# Patient Record
Sex: Male | Born: 1990 | Race: Black or African American | Hispanic: No | Marital: Single | Smoking: Current every day smoker
Health system: Southern US, Community
[De-identification: ages and names within clinical notes are randomized; demographics above are authoritative.]

## PROBLEM LIST (undated history)

## (undated) DIAGNOSIS — I10 Essential (primary) hypertension: Secondary | ICD-10-CM

## (undated) HISTORY — PX: HAND SURGERY: SHX662

---
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
## 2014-06-27 ENCOUNTER — Encounter (HOSPITAL_BASED_OUTPATIENT_CLINIC_OR_DEPARTMENT_OTHER): Payer: Self-pay | Admitting: Emergency Medicine

## 2014-06-27 ENCOUNTER — Emergency Department (HOSPITAL_BASED_OUTPATIENT_CLINIC_OR_DEPARTMENT_OTHER)
Admission: EM | Admit: 2014-06-27 | Discharge: 2014-06-28 | Disposition: A | Discharge status: Home / Self Care | Payer: Self-pay | Attending: Emergency Medicine | Admitting: Emergency Medicine

## 2014-06-27 DIAGNOSIS — F17200 Nicotine dependence, unspecified, uncomplicated: Secondary | ICD-10-CM | POA: Insufficient documentation

## 2014-06-27 DIAGNOSIS — I10 Essential (primary) hypertension: Secondary | ICD-10-CM | POA: Insufficient documentation

## 2014-06-27 DIAGNOSIS — R1110 Vomiting, unspecified: Secondary | ICD-10-CM | POA: Insufficient documentation

## 2014-06-27 DIAGNOSIS — R197 Diarrhea, unspecified: Secondary | ICD-10-CM | POA: Insufficient documentation

## 2014-06-27 DIAGNOSIS — J020 Streptococcal pharyngitis: Secondary | ICD-10-CM | POA: Insufficient documentation

## 2014-06-27 HISTORY — DX: Essential (primary) hypertension: I10

## 2014-06-27 LAB — CBC WITH DIFFERENTIAL/PLATELET
BASOS ABS: 0 10*3/uL (ref 0.0–0.1)
BASOS PCT: 0 % (ref 0–1)
Eosinophils Absolute: 0 10*3/uL (ref 0.0–0.7)
Eosinophils Relative: 0 % (ref 0–5)
HCT: 43.6 % (ref 39.0–52.0)
Hemoglobin: 15.1 g/dL (ref 13.0–17.0)
Lymphocytes Relative: 15 % (ref 12–46)
Lymphs Abs: 2.4 10*3/uL (ref 0.7–4.0)
MCH: 30.3 pg (ref 26.0–34.0)
MCHC: 34.6 g/dL (ref 30.0–36.0)
MCV: 87.6 fL (ref 78.0–100.0)
MONO ABS: 1.4 10*3/uL — AB (ref 0.1–1.0)
Monocytes Relative: 9 % (ref 3–12)
NEUTROS ABS: 12.4 10*3/uL — AB (ref 1.7–7.7)
Neutrophils Relative %: 76 % (ref 43–77)
PLATELETS: 211 10*3/uL (ref 150–400)
RBC: 4.98 MIL/uL (ref 4.22–5.81)
RDW: 12.4 % (ref 11.5–15.5)
WBC: 16.3 10*3/uL — ABNORMAL HIGH (ref 4.0–10.5)

## 2014-06-27 MED ORDER — ONDANSETRON 8 MG PO TBDP
8.0000 mg | ORAL_TABLET | Freq: Once | ORAL | Status: AC
  Administered 2014-06-27: 8 mg via ORAL

## 2014-06-27 MED ORDER — ACETAMINOPHEN 500 MG PO TABS
ORAL_TABLET | ORAL | Status: AC
  Filled 2014-06-27: qty 2

## 2014-06-27 MED ORDER — ACETAMINOPHEN 325 MG PO TABS
650.0000 mg | ORAL_TABLET | Freq: Once | ORAL | Status: AC
  Administered 2014-06-27: 650 mg via ORAL
  Filled 2014-06-27: qty 2

## 2014-06-27 MED ORDER — SODIUM CHLORIDE 0.9 % IV BOLUS (SEPSIS)
1000.0000 mL | Freq: Once | INTRAVENOUS | Status: AC
  Administered 2014-06-27: 1000 mL via INTRAVENOUS

## 2014-06-27 MED ORDER — ONDANSETRON 8 MG PO TBDP
ORAL_TABLET | ORAL | Status: AC
  Filled 2014-06-27: qty 1

## 2014-06-27 NOTE — ED Notes (Signed)
Pt n/v/d fever chills x 2 days

## 2014-06-28 ENCOUNTER — Emergency Department (HOSPITAL_BASED_OUTPATIENT_CLINIC_OR_DEPARTMENT_OTHER): Payer: Self-pay

## 2014-06-28 LAB — URINALYSIS, ROUTINE W REFLEX MICROSCOPIC
Bilirubin Urine: NEGATIVE
Glucose, UA: NEGATIVE mg/dL
Hgb urine dipstick: NEGATIVE
Ketones, ur: NEGATIVE mg/dL
Leukocytes, UA: NEGATIVE
NITRITE: NEGATIVE
Protein, ur: NEGATIVE mg/dL
SPECIFIC GRAVITY, URINE: 1.021 (ref 1.005–1.030)
Urobilinogen, UA: 0.2 mg/dL (ref 0.0–1.0)
pH: 6 (ref 5.0–8.0)

## 2014-06-28 LAB — COMPREHENSIVE METABOLIC PANEL
ALBUMIN: 3.8 g/dL (ref 3.5–5.2)
ALT: 80 U/L — ABNORMAL HIGH (ref 0–53)
AST: 41 U/L — AB (ref 0–37)
Alkaline Phosphatase: 45 U/L (ref 39–117)
Anion gap: 12 (ref 5–15)
BUN: 11 mg/dL (ref 6–23)
CHLORIDE: 100 meq/L (ref 96–112)
CO2: 26 mEq/L (ref 19–32)
CREATININE: 0.9 mg/dL (ref 0.50–1.35)
Calcium: 9.5 mg/dL (ref 8.4–10.5)
GFR calc Af Amer: >90 mL/min (ref >90)
GFR calc non Af Amer: >90 mL/min (ref >90)
Glucose, Bld: 116 mg/dL — ABNORMAL HIGH (ref 70–99)
Potassium: 3.8 mEq/L (ref 3.7–5.3)
Sodium: 138 mEq/L (ref 137–147)
Total Bilirubin: 0.5 mg/dL (ref 0.3–1.2)
Total Protein: 7.2 g/dL (ref 6.0–8.3)

## 2014-06-28 LAB — RAPID STREP SCREEN (MED CTR MEBANE ONLY): STREPTOCOCCUS, GROUP A SCREEN (DIRECT): POSITIVE — AB

## 2014-06-28 LAB — LIPASE, BLOOD: LIPASE: 24 U/L (ref 11–59)

## 2014-06-28 MED ORDER — IBUPROFEN 800 MG PO TABS: 800.0000 mg | ORAL_TABLET | Freq: Three times a day (TID) | ORAL | Status: DC

## 2014-06-28 MED ORDER — PENICILLIN G BENZATHINE 1200000 UNIT/2ML IM SUSP
1.2000 10*6.[IU] | Freq: Once | INTRAMUSCULAR | Status: AC
  Administered 2014-06-28: 1.2 10*6.[IU] via INTRAMUSCULAR
  Filled 2014-06-28: qty 2

## 2014-06-28 NOTE — ED Provider Notes (Signed)
Medical screening examination/treatment/procedure(s) were performed by non-physician practitioner and as supervising physician I was immediately available for consultation/collaboration.   EKG Interpretation None       Hillary Schwegler K Sieara Bremer-Rasch, MD 06/28/14 [phone]

## 2014-06-28 NOTE — ED Provider Notes (Signed)
CSN: [id number]     Arrival date & time 06/27/14  2215 History   First MD Initiated Contact with Patient 06/27/14 2349     Chief Complaint  Patient presents with  . Emesis     (Consider location/radiation/quality/duration/timing/severity/associated sxs/prior Treatment) HPI Comments: Victor Richards is a 23 y.o. Male with a PMHx of HTN presenting today with fever, N/V/D and body aches x 1 day associated with decreased appetite but no abd pain. States he felt febrile yesterday and left work, but did not take his temperature. Has had a cough x 2 days and sore throat x2 days, with white sputum production but no SOB or CP. Denies HA, rhinorrhea, sinus pressure/congestion, ear pain, eye pain or discharge, abd pain, bloody stools, dysuria, hematuria, rashes or tick bites, penile discharge or pain. Denies any sick contacts. Denies environmental allergies. States he smokes 1/2ppd and occasionally smokes marijuana. Has not tried anything for his symptoms, and there are no aggravating factors. Denies trismus or muffled voice. Denies stridor or wheezing.  Patient is a 23 y.o. male presenting with fever. The history is provided by the patient. No language interpreter was used.  Fever Max temp prior to arrival:  Unknown Temp source:  Subjective Severity:  Moderate Onset quality:  Sudden Duration:  1 day Timing:  Constant Progression:  Unchanged Chronicity:  New Relieved by:  None tried Worsened by:  Nothing tried Ineffective treatments:  None tried Associated symptoms: chills, cough, diarrhea, myalgias, nausea, sore throat and vomiting   Associated symptoms: no chest pain, no confusion, no congestion, no dysuria, no ear pain, no headaches, no rash and no rhinorrhea   Risk factors: no recent travel and no sick contacts     Past Medical History  Diagnosis Date  . Hypertension    Past Surgical History  Procedure Laterality Date  . Hand surgery     History reviewed. No pertinent family  history. History  Substance Use Topics  . Smoking status: Current Every Day Smoker -- 0.50 packs/day    Types: Cigarettes  . Smokeless tobacco: Not on file  . Alcohol Use: No    Review of Systems  Constitutional: Positive for fever, chills and appetite change.  HENT: Positive for sore throat. Negative for congestion, drooling, ear discharge, ear pain, hearing loss, mouth sores, postnasal drip, rhinorrhea, sinus pressure, sneezing, tinnitus, trouble swallowing and voice change.   Eyes: Negative for redness and itching.  Respiratory: Positive for cough. Negative for shortness of breath, wheezing and stridor.   Cardiovascular: Negative for chest pain, palpitations and leg swelling.  Gastrointestinal: Positive for nausea, vomiting and diarrhea. Negative for abdominal pain, constipation and blood in stool.  Genitourinary: Negative for dysuria, urgency, hematuria, flank pain, decreased urine volume, penile swelling and testicular pain.  Musculoskeletal: Positive for myalgias. Negative for arthralgias, back pain, neck pain and neck stiffness.  Skin: Negative for color change and rash.  Allergic/Immunologic: Negative for environmental allergies.  Neurological: Negative for dizziness, syncope, weakness and headaches.  Psychiatric/Behavioral: Negative for confusion.  10 Systems reviewed and are negative for acute change except as noted in the HPI.     Allergies  Review of patient's allergies indicates no known allergies.  Home Medications   Prior to Admission medications   Not on File   BP 137/80  Pulse 119  Temp(Src) 101.7 F (38.7 C)  Resp 16  Ht 6\' 5"  (1.956 m)  Wt 310 lb (140.615 kg)  BMI 36.75 kg/m2  SpO2 98% Physical Exam  Nursing note  and vitals reviewed. Constitutional: He is oriented to person, place, and time. He appears well-developed and well-nourished. No distress.  Febrile, nontoxic, appears fatigued  HENT:  Head: Normocephalic and atraumatic.  Right Ear:  Tympanic membrane, external ear and ear canal normal.  Left Ear: Tympanic membrane, external ear and ear canal normal.  Nose: No mucosal edema, rhinorrhea or sinus tenderness. Right sinus exhibits no maxillary sinus tenderness and no frontal sinus tenderness. Left sinus exhibits no maxillary sinus tenderness and no frontal sinus tenderness.  Mouth/Throat: Uvula is midline and mucous membranes are normal. No trismus in the jaw. No uvula swelling. Posterior oropharyngeal erythema present. No oropharyngeal exudate, posterior oropharyngeal edema or tonsillar abscesses.  [place]/AT, ears clear bilaterally with no TM injection or bulging. Nose clear with no edema or erythema, no rhinorrhea. Sinuses nonTTP. Uvula midline with no peritonsillar abscess. Mildly injected pharynx with no tonsillar exudate. MMM  Eyes: Conjunctivae and EOM are normal. Pupils are equal, round, and reactive to light. Right eye exhibits no discharge. Left eye exhibits no discharge.  PERRL, EOMI, no conjunctival injection  Neck: Normal range of motion. Neck supple. No spinous process tenderness and no muscular tenderness present. No rigidity. Normal range of motion present.  FROM intact, no spinous process or paraspinous muscle TTP, no rigidity or meningeal signs  Cardiovascular: Normal rate, regular rhythm, normal heart sounds and intact distal pulses.  Exam reveals no gallop and no friction rub.   No murmur heard. RRR, no m/r/g, distal pulses intact. Tachycardia resolved by time of exam.  Pulmonary/Chest: Effort normal. No respiratory distress. He has decreased breath sounds in the right lower field and the left lower field. He has no wheezes. He has no rhonchi. He has no rales.  Difficult due to body habitus and poor inspiratory effort, but slightly diminished sounds in lung bases bilaterally, no w/r/r noted, no resp distress or stridor.  Abdominal: Soft. Normal appearance and bowel sounds are normal. He exhibits no distension. There is  no tenderness. There is no rigidity, no rebound, no guarding, no CVA tenderness, no tenderness at McBurney's point and negative Murphy's sign.  Soft, NT/ND, no r/g/r, neg Murphy's and neg McBurney's, no CVA TTP  Musculoskeletal: Normal range of motion.  Lymphadenopathy:       Head (right side): Submental and tonsillar adenopathy present. No submandibular, no preauricular, no posterior auricular and no occipital adenopathy present.       Head (left side): Submental and tonsillar adenopathy present. No submandibular, no preauricular, no posterior auricular and no occipital adenopathy present.    He has cervical adenopathy.       Right cervical: Superficial cervical and deep cervical adenopathy present. No posterior cervical adenopathy present.      Left cervical: Superficial cervical and deep cervical adenopathy present. No posterior cervical adenopathy present.       Right: No supraclavicular adenopathy present.       Left: No supraclavicular adenopathy present.  Diffuse shotty anterior cervical, tonsillar, and submental tender LAD, no posterior cervical or occipital LAD.  Neurological: He is alert and oriented to person, place, and time.  Skin: Skin is warm, dry and intact. No rash noted.  Psychiatric: He has a normal mood and affect.    ED Course  Procedures (including critical care time) Labs Review Labs Reviewed  RAPID STREP SCREEN - Abnormal; Notable for the following:    Streptococcus, Group A Screen (Direct) POSITIVE (*)    All other components within normal limits  CBC WITH DIFFERENTIAL -  Abnormal; Notable for the following:    WBC 16.3 (*)    Neutro Abs 12.4 (*)    Monocytes Absolute 1.4 (*)    All other components within normal limits  COMPREHENSIVE METABOLIC PANEL - Abnormal; Notable for the following:    Glucose, Bld 116 (*)    AST 41 (*)    ALT 80 (*)    All other components within normal limits  LIPASE, BLOOD  URINALYSIS, ROUTINE W REFLEX MICROSCOPIC    Imaging  Review Dg Chest 2 View  06/28/2014   CLINICAL DATA:  Fever and chills.  EXAM: CHEST  2 VIEW  COMPARISON:  None.  FINDINGS: Cardiomediastinal silhouette is unremarkable. Mildly elevated right hemidiaphragm. The lungs are clear without pleural effusions or focal consolidations. Trachea projects midline and there is no pneumothorax. Soft tissue planes and included osseous structures are non-suspicious.  IMPRESSION: No acute cardiopulmonary process.   Electronically Signed   By: Awilda Metro   On: 06/28/2014 01:13     EKG Interpretation None      MDM   Final diagnoses:  None    Victor Richards is a 23 y.o. male with a PMHx of HTN presenting with fevers and generalized body aches, N/V, and sore throat x 2 days. Febrile at 101.7, tachycardic 119 in triage which resolved at time of exam, nontoxic appearing with no PTA but mildly erythematous throat. Diminished basal lung sounds likely related to poor effort but will obtain CXR, basic labs, U/A, and give zofran/tylenol/fluids.   1:15 AM CXR neg, CBC showing WBC of 16.3 and mildly bumped LFTs with lipase and lytes WNL. U/A neg. CENTOR criteria with 2 points but with no other source after CXR and U/A were negative, proceeded with strep test which was positive. Given PCN G here now. Will PO challenge now. Care signed over to Dr. Nicanor Alcon at this time, see her dictation for further results and dispo.   BP 137/80  Pulse 119  Temp(Src) 101.7 F (38.7 C)  Resp 16  Ht 6\' 5"  (1.956 m)  Wt 310 lb (140.615 kg)  BMI 36.75 kg/m2  SpO2 98%   [employer], PA-C 06/28/14 0231

## 2014-06-28 NOTE — Discharge Instructions (Signed)

## 2014-12-05 ENCOUNTER — Encounter (HOSPITAL_BASED_OUTPATIENT_CLINIC_OR_DEPARTMENT_OTHER): Payer: Self-pay

## 2014-12-05 ENCOUNTER — Emergency Department (HOSPITAL_BASED_OUTPATIENT_CLINIC_OR_DEPARTMENT_OTHER)
Admission: EM | Admit: 2014-12-05 | Discharge: 2014-12-05 | Disposition: A | Discharge status: Home / Self Care | Payer: Self-pay | Attending: Emergency Medicine | Admitting: Emergency Medicine

## 2014-12-05 DIAGNOSIS — Z791 Long term (current) use of non-steroidal anti-inflammatories (NSAID): Secondary | ICD-10-CM | POA: Insufficient documentation

## 2014-12-05 DIAGNOSIS — I10 Essential (primary) hypertension: Secondary | ICD-10-CM | POA: Insufficient documentation

## 2014-12-05 DIAGNOSIS — Z720 Tobacco use: Secondary | ICD-10-CM | POA: Insufficient documentation

## 2014-12-05 DIAGNOSIS — B356 Tinea cruris: Secondary | ICD-10-CM | POA: Insufficient documentation

## 2014-12-05 MED ORDER — CLOTRIMAZOLE 1 % EX CREA: TOPICAL_CREAM | CUTANEOUS | Status: DC

## 2014-12-05 NOTE — ED Provider Notes (Signed)
CSN: [id number]     Arrival date & time 12/05/14  1202 History   First MD Initiated Contact with Patient 12/05/14 1207     Chief Complaint  Patient presents with  . Rash     (Consider location/radiation/quality/duration/timing/severity/associated sxs/prior Treatment) HPI Comments: Patient presents today with a rash to the bilateral groin area.  He reports that the rash has been present for the past 2 weeks and is spreading.  He tried an unknown OTC cream for the rash, but does not feel that it helps.  He states that the rash is pruritic and also burns.  He states that he does sweat a lot and this area often is moist.  He denies fever, chills, nausea, vomiting, abdominal pain, scrotal pain, scrotal swelling, or penile discharge.  He states that he has never had a rash like this before.  No known contacts with similar rash.  Patient is a 24 y.o. male presenting with rash. The history is provided by the patient.  Rash   Past Medical History  Diagnosis Date  . Hypertension    Past Surgical History  Procedure Laterality Date  . Hand surgery     No family history on file. History  Substance Use Topics  . Smoking status: Current Every Day Smoker -- 0.50 packs/day    Types: Cigarettes  . Smokeless tobacco: Not on file  . Alcohol Use: No    Review of Systems  Skin: Positive for rash.  All other systems reviewed and are negative.     Allergies  Review of patient's allergies indicates no known allergies.  Home Medications   Prior to Admission medications   Medication Sig Start Date End Date Taking? Authorizing Provider  ibuprofen (ADVIL,MOTRIN) 800 MG tablet Take 1 tablet (800 mg total) by mouth 3 (three) times daily. 06/28/14   April K Palumbo-Rasch, MD   BP 156/95 mmHg  Pulse 89  Temp(Src) 98.2 F (36.8 C) (Oral)  Resp 18  Ht  (1.956 m)  Wt 320 lb (145.151 kg)  BMI 37.94 kg/m2  SpO2 96% Physical Exam  Constitutional: He appears well-developed and well-nourished.   HENT:  Head: Normocephalic and atraumatic.  Mouth/Throat: Oropharynx is clear and moist.  Neck: Normal range of motion. Neck supple.  Cardiovascular: Normal rate, regular rhythm and normal heart sounds.   Pulmonary/Chest: Effort normal and breath sounds normal.  Genitourinary: Penis normal. Right testis shows no mass, no swelling and no tenderness. Left testis shows no mass, no swelling and no tenderness. Uncircumcised. No discharge found.  Erythematous scaly rash located in the inguinal area bilaterally extending to the scrotum  Neurological: He is alert.  Skin: Skin is warm and dry. Rash noted.  Psychiatric: He has a normal mood and affect.  Nursing note and vitals reviewed.   ED Course  Procedures (including critical care time) Labs Review Labs Reviewed - No data to display  Imaging Review No results found.   EKG Interpretation None      MDM   Final diagnoses:  Tinea cruris   Patient presents today with a rash in the groin area bilaterally.  History and physical most consistent with Tinea Cruris.  Patient given Rx for antifungal.  Stable for discharge.  Return precautions given.    Santiago Glad, PA-C 12/07/14 1603  Glynn Octave, MD 12/07/14 [phone]

## 2014-12-05 NOTE — ED Notes (Signed)
Pt reports several days of pruritic rash on groin area, worsening.  Denies penile drainage.

## 2014-12-05 NOTE — Discharge Instructions (Signed)
Jock Itch Jock itch is a fungal infection of the skin in the groin area. It is sometimes called "ringworm" even though it is not caused by a worm. A fungus is a type of germ that thrives in dark, damp places.  CAUSES  This infection may spread from:  A fungus infection elsewhere on the body (such as athlete's foot).  Sharing towels or clothing. This infection is more common in:  Hot, humid climates.  People who wear tight-fitting clothing or wet bathing suits for long periods of time.  Athletes.  Overweight people.  People with diabetes. SYMPTOMS  Jock itch causes the following symptoms:  Red, pink or brown rash in the groin. Rash may spread to the thighs, anus, and buttocks.  Itching. DIAGNOSIS  Your caregiver may make the diagnosis by looking at the rash. Sometimes a skin scraping will be sent to test for fungus. Testing can be done either by looking under the microscope or by doing a culture (test to try to grow the fungus). A culture can take up to 2 weeks to come back. TREATMENT  Jock itch may be treated with:  Skin cream or ointment to kill fungus.  Medicine by mouth to kill fungus.  Skin cream or ointment to calm the itching.  Compresses or medicated powders to dry the infected skin. HOME CARE INSTRUCTIONS   Be sure to treat the rash completely. Follow your caregiver's instructions. It can take a couple of weeks to treat. If you do not treat the infection long enough, the rash can come back.  Wear loose-fitting clothing.  Men should wear cotton boxer shorts.  Women should wear cotton underwear.  Avoid hot baths.  Dry the groin area well after bathing. SEEK MEDICAL CARE IF:   Your rash is worse.  Your rash is spreading.  Your rash returns after treatment is finished.  Your rash is not gone in 4 weeks. Fungal infections are slow to respond to treatment. Some redness may remain for several weeks after the fungus is gone. SEEK IMMEDIATE MEDICAL CARE  IF:  The area becomes red, warm, tender, and swollen.  You have a fever. Document Released: 11/08/2002 Document Revised: 02/10/2012 Document Reviewed: 10/07/2008 ExitCare Patient Information 2015 [employer]. This information is not intended to replace advice given to you by your health care provider. Make sure you discuss any questions you have with your health care provider.  

## 2015-07-10 ENCOUNTER — Encounter (HOSPITAL_BASED_OUTPATIENT_CLINIC_OR_DEPARTMENT_OTHER): Payer: Self-pay

## 2015-07-10 ENCOUNTER — Emergency Department (HOSPITAL_BASED_OUTPATIENT_CLINIC_OR_DEPARTMENT_OTHER): Payer: Self-pay

## 2015-07-10 ENCOUNTER — Emergency Department (HOSPITAL_BASED_OUTPATIENT_CLINIC_OR_DEPARTMENT_OTHER)
Admission: EM | Admit: 2015-07-10 | Discharge: 2015-07-10 | Disposition: A | Discharge status: Home / Self Care | Payer: Self-pay | Attending: Emergency Medicine | Admitting: Emergency Medicine

## 2015-07-10 DIAGNOSIS — M25511 Pain in right shoulder: Secondary | ICD-10-CM

## 2015-07-10 DIAGNOSIS — S4991XA Unspecified injury of right shoulder and upper arm, initial encounter: Secondary | ICD-10-CM | POA: Insufficient documentation

## 2015-07-10 DIAGNOSIS — Y998 Other external cause status: Secondary | ICD-10-CM | POA: Insufficient documentation

## 2015-07-10 DIAGNOSIS — Z720 Tobacco use: Secondary | ICD-10-CM | POA: Insufficient documentation

## 2015-07-10 DIAGNOSIS — I10 Essential (primary) hypertension: Secondary | ICD-10-CM | POA: Insufficient documentation

## 2015-07-10 DIAGNOSIS — Y9389 Activity, other specified: Secondary | ICD-10-CM | POA: Insufficient documentation

## 2015-07-10 DIAGNOSIS — Y92410 Unspecified street and highway as the place of occurrence of the external cause: Secondary | ICD-10-CM | POA: Insufficient documentation

## 2015-07-10 MED ORDER — NAPROXEN 500 MG PO TABS: 500.0000 mg | ORAL_TABLET | Freq: Two times a day (BID) | ORAL | Status: AC

## 2015-07-10 MED ORDER — IBUPROFEN 800 MG PO TABS
800.0000 mg | ORAL_TABLET | Freq: Once | ORAL | Status: AC
  Administered 2015-07-10: 800 mg via ORAL
  Filled 2015-07-10: qty 1
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
## 2015-07-10 NOTE — Discharge Instructions (Signed)
Shoulder Pain  The shoulder is the joint that connects your arm to your body. Muscles and band-like tissues that connect bones to muscles (tendons) hold the joint together. Shoulder pain is felt if an injury or medical problem affects one or more parts of the shoulder.  HOME CARE   · Put ice on the sore area.  ¨ Put ice in a plastic bag.  ¨ Place a towel between your skin and the bag.  ¨ Leave the ice on for 15-20 minutes, 03-04 times a day for the first 2 days.  · Stop using cold packs if they do not help with the pain.  · If you were given something to keep your shoulder from moving (sling; shoulder immobilizer), wear it as told. Only take it off to shower or bathe.  · Move your arm as little as possible, but keep your hand moving to prevent puffiness (swelling).  · Squeeze a soft ball or foam pad as much as possible to help prevent swelling.  · Take medicine as told by your doctor.  GET HELP IF:  · You have progressing new pain in your arm, hand, or fingers.  · Your hand or fingers get cold.  · Your medicine does not help lessen your pain.  GET HELP RIGHT AWAY IF:   · Your arm, hand, or fingers are numb or tingling.  · Your arm, hand, or fingers are puffy (swollen), painful, or turn white or blue.  MAKE SURE YOU:   · Understand these instructions.  · Will watch your condition.  · Will get help right away if you are not doing well or get worse.  Document Released: 05/06/2008 Document Revised: 04/04/2014 Document Reviewed: 06/01/2012  ExitCare® Patient Information ©2015 [employer]. This information is not intended to replace advice given to you by your health care provider. Make sure you discuss any questions you have with your health care provider.
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
